# Patient Record
Sex: Male | Born: 2000 | Race: Black or African American | Hispanic: No | Marital: Single | Smoking: Never smoker
Health system: Southern US, Community
[De-identification: ages and names within clinical notes are randomized; demographics above are authoritative.]

## PROBLEM LIST (undated history)

## (undated) DIAGNOSIS — S82892A Other fracture of left lower leg, initial encounter for closed fracture: Secondary | ICD-10-CM

---
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
## 2022-01-05 ENCOUNTER — Encounter (HOSPITAL_COMMUNITY): Payer: Self-pay | Admitting: *Deleted

## 2022-01-05 ENCOUNTER — Emergency Department (HOSPITAL_COMMUNITY)
Admission: EM | Admit: 2022-01-05 | Discharge: 2022-01-05 | Disposition: A | Discharge status: Home / Self Care | Payer: Medicaid Other | Attending: Emergency Medicine | Admitting: Emergency Medicine

## 2022-01-05 ENCOUNTER — Other Ambulatory Visit: Payer: Self-pay

## 2022-01-05 DIAGNOSIS — L02411 Cutaneous abscess of right axilla: Secondary | ICD-10-CM | POA: Diagnosis present

## 2022-01-05 MED ORDER — DOXYCYCLINE HYCLATE 100 MG PO CAPS: 100.0000 mg | ORAL_CAPSULE | Freq: Two times a day (BID) | ORAL | 0 refills | Status: DC

## 2022-01-05 NOTE — Discharge Instructions (Addendum)
You are seen in today for evaluation of a bump under your right arm.  At this time the bump is too small to drain.  I have placed you on an antibiotic to take 2 times a day for the next 7 days.  Please apply warm compresses to the area multiple times daily to see if it will come to ahead.  This may need to be drained at a later date.  You can take ibuprofen as needed for pain.  If you have any concern, new or worsening symptoms, please return to the nearest emergency department for evaluation.

## 2022-01-05 NOTE — ED Provider Notes (Signed)
[facility] EMERGENCY DEPARTMENT Provider Note   CSN: [id number] Arrival date & time: 01/05/22  1251     History Chief Complaint  Patient presents with   Abscess    Marcus Sutton is a 21 y.o. male presents to the emergency department for evaluation of small bump under right axilla for the past two days. Reports tenderness, no radiation.  No drainage.  No prior incidence of this. Denies any medical or surgical history.  Denies any new medications.  No known drug allergies.  Denies any tobacco, EtOH, or illicit drug use.   Abscess     Home Medications Prior to Admission medications   Medication Sig Start Date End Date Taking? Authorizing Provider  doxycycline (VIBRAMYCIN) 100 MG capsule Take 1 capsule (100 mg total) by mouth 2 (two) times daily. 01/05/22  Yes Achille Rich, PA-C  meclizine (ANTIVERT) 12.5 MG tablet Take 1 tablet (12.5 mg total) by mouth 3 (three) times daily as needed for dizziness. 06/07/19   Robinson, [place] N, PA-C  methocarbamol (ROBAXIN) 500 MG tablet Take 1 tablet (500 mg total) by mouth 2 (two) times daily. 08/24/21   Couture, Cortni S, PA-C  naproxen (NAPROSYN) 375 MG tablet Take 1 tablet (375 mg total) by mouth 2 (two) times daily. 08/24/21   Couture, Cortni S, PA-C      Allergies    Patient has no known allergies.    Review of Systems   Review of Systems  Skin:  Positive for wound.  All other systems reviewed and are negative.  Physical Exam Updated Vital Signs BP 101/71 (BP Location: Right Arm)    Pulse 87    Temp 97.9 F (36.6 C) (Oral)    Resp 16    Ht 5\' 9"  (1.753 m)    Wt 56.7 kg    SpO2 97%    BMI 18.46 kg/m  Physical Exam Vitals and nursing note reviewed.  Constitutional:      General: He is not in acute distress.    Appearance: Normal appearance. He is not toxic-appearing.  Eyes:     General: No scleral icterus. Pulmonary:     Effort: Pulmonary effort is normal. No respiratory distress.  Skin:    General: Skin is dry.     Findings:  No rash.     Comments: Small area of induration around 1cm in size under the right axilla. No central pustule. No drainage. No fluctuance. No surrounding erythema or red streaking.   Neurological:     General: No focal deficit present.     Mental Status: He is alert. Mental status is at baseline.  Psychiatric:        Mood and Affect: Mood normal.    ED Results / Procedures / Treatments   Labs (all labs ordered are listed, but only abnormal results are displayed) Labs Reviewed - No data to display  EKG None  Radiology No results found.  Procedures Procedures   Medications Ordered in ED Medications - No data to display  ED Course/ Medical Decision Making/ A&P                           Medical Decision Making  21 year old male presents emerged part for evaluation of bump under his right axilla for the past 2 days.  Differential diagnosis includes cyst versus abscess versus lymphadenopathy.  Vital signs stable.  Afebrile.  Physical exam shows a small area of smaller 1 cm area of induration with  no fluctuance, overlying erythema, or red streaking.  Mildly tender to palpation.  Will discharge home on doxycycline and recommend warm compresses to the area daily.  Strict return precautions discussed.  Patient agrees to plan.  Patient is stable be discharged home in good condition.  Final Clinical Impression(s) / ED Diagnoses Final diagnoses:  Abscess of axilla, right    Rx / DC Orders ED Discharge Orders          Ordered    doxycycline (VIBRAMYCIN) 100 MG capsule  2 times daily        01/05/22 1459              Achille Rich, PA-C 01/05/22 1500    Bethann Berkshire, MD 01/08/22 1012

## 2022-01-05 NOTE — ED Triage Notes (Signed)
Pt c/o abscess to right axilla x 2 days; pt denies any drainage

## 2022-05-21 ENCOUNTER — Ambulatory Visit (HOSPITAL_COMMUNITY)
Admission: RE | Admit: 2022-05-21 | Discharge: 2022-05-21 | Disposition: A | Discharge status: Home / Self Care | Payer: Medicaid Other | Source: Ambulatory Visit | Attending: Physician Assistant | Admitting: Physician Assistant

## 2022-05-21 ENCOUNTER — Other Ambulatory Visit (HOSPITAL_COMMUNITY): Payer: Self-pay | Admitting: Physician Assistant

## 2022-05-21 DIAGNOSIS — M549 Dorsalgia, unspecified: Secondary | ICD-10-CM | POA: Diagnosis not present

## 2022-08-13 ENCOUNTER — Ambulatory Visit (HOSPITAL_COMMUNITY): Payer: Medicaid Other | Attending: Sports Medicine

## 2023-02-08 IMAGING — DX DG SCOLIOSIS EVAL COMPLETE SPINE 1V
1 series · 4 of 4 positions shown · non-contrast
Comparison: None Available.

CLINICAL DATA: Back pain.

EXAM:
DG SCOLIOSIS EVAL COMPLETE SPINE 1V

[Series 1: whole body ap · 0.13mm/px · 4 of 4 slices shown]
[im 1/4]
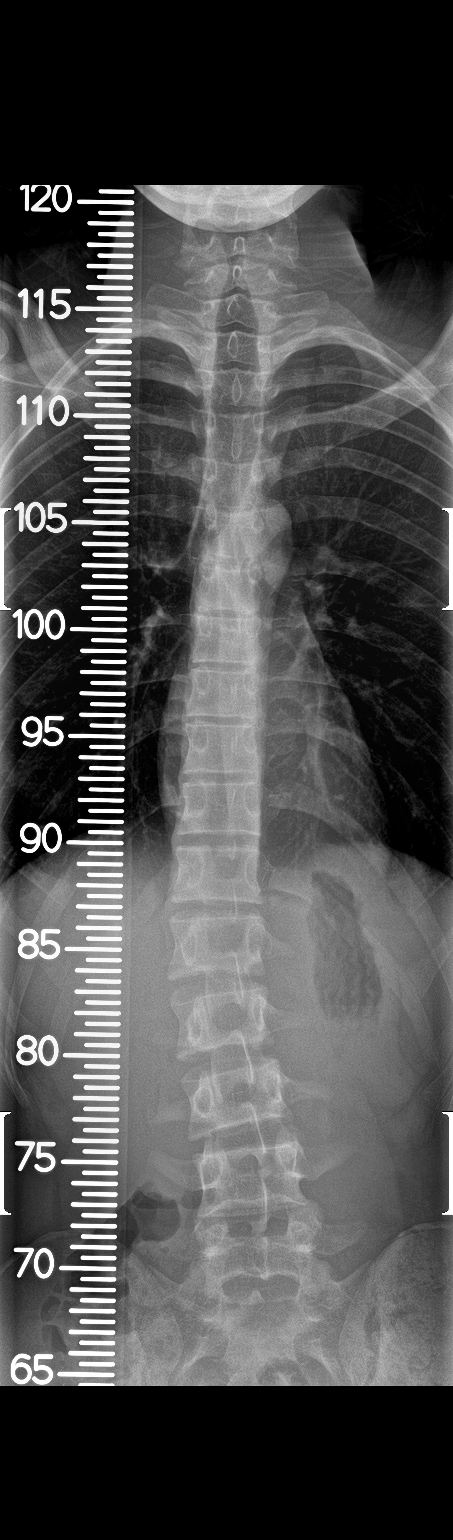
[im 2/4]
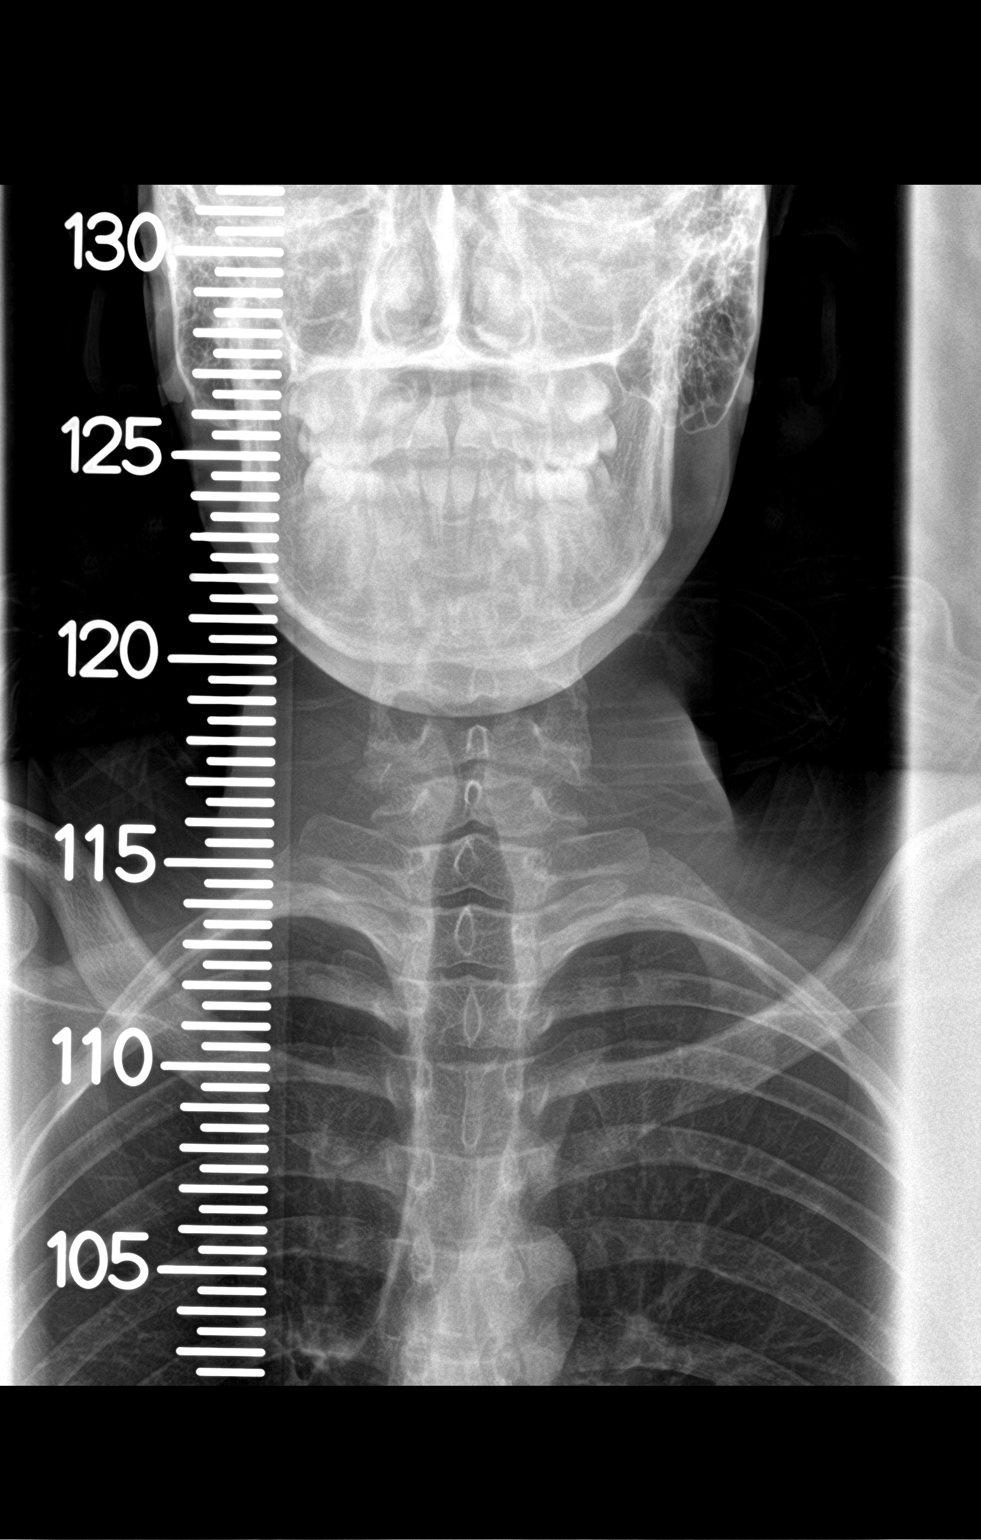
[im 3/4]
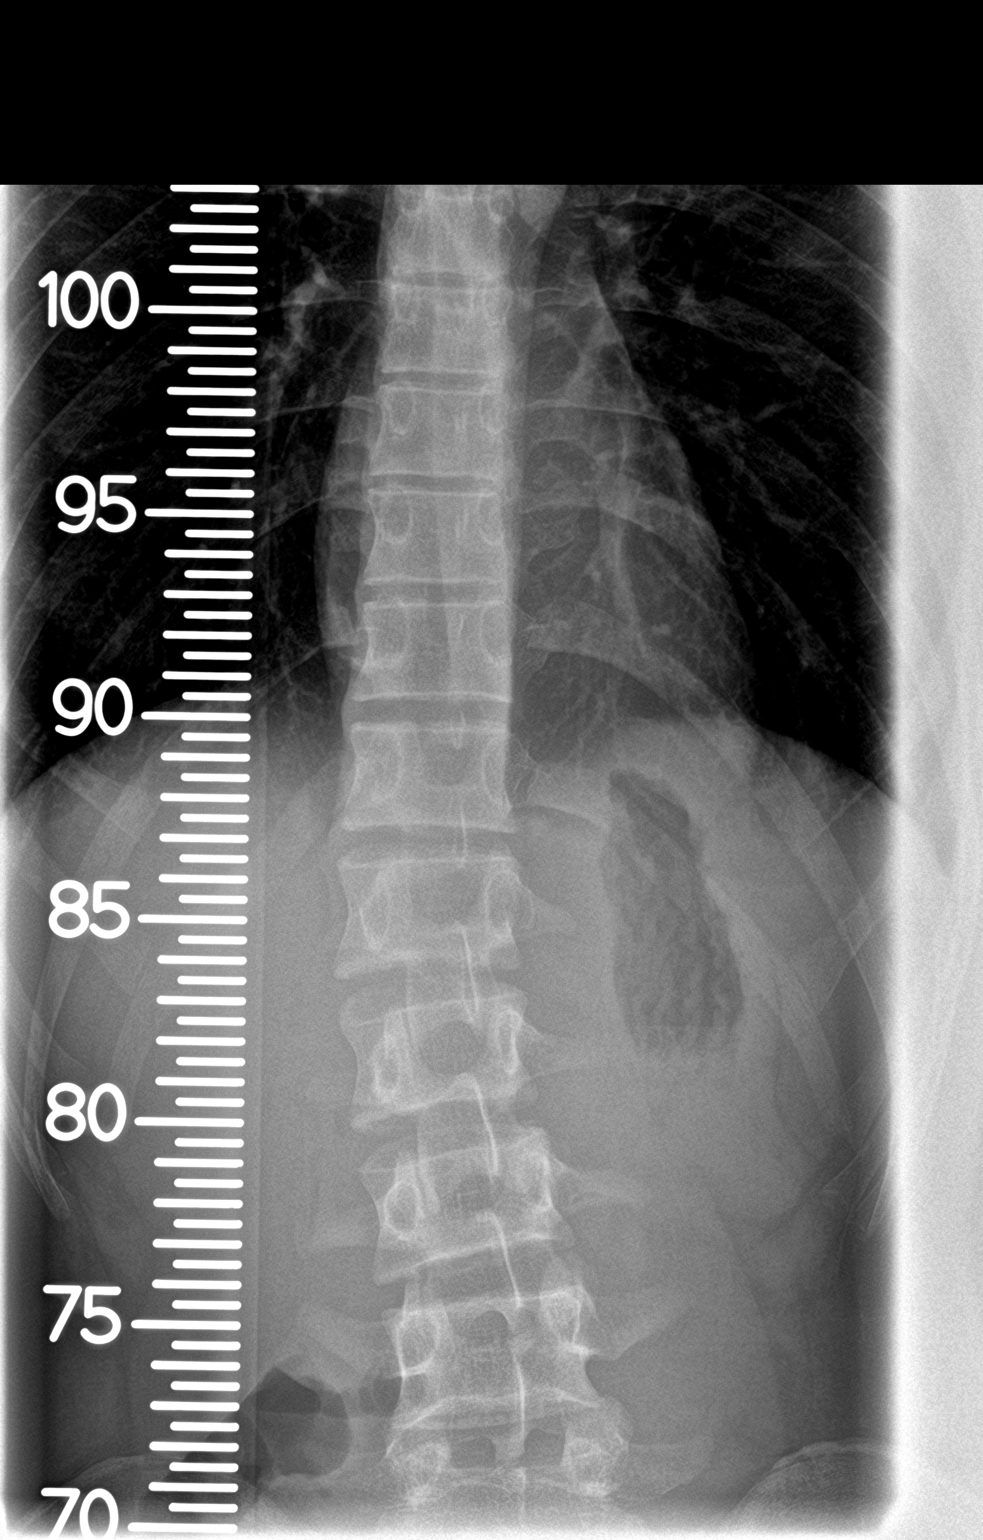
[im 4/4]
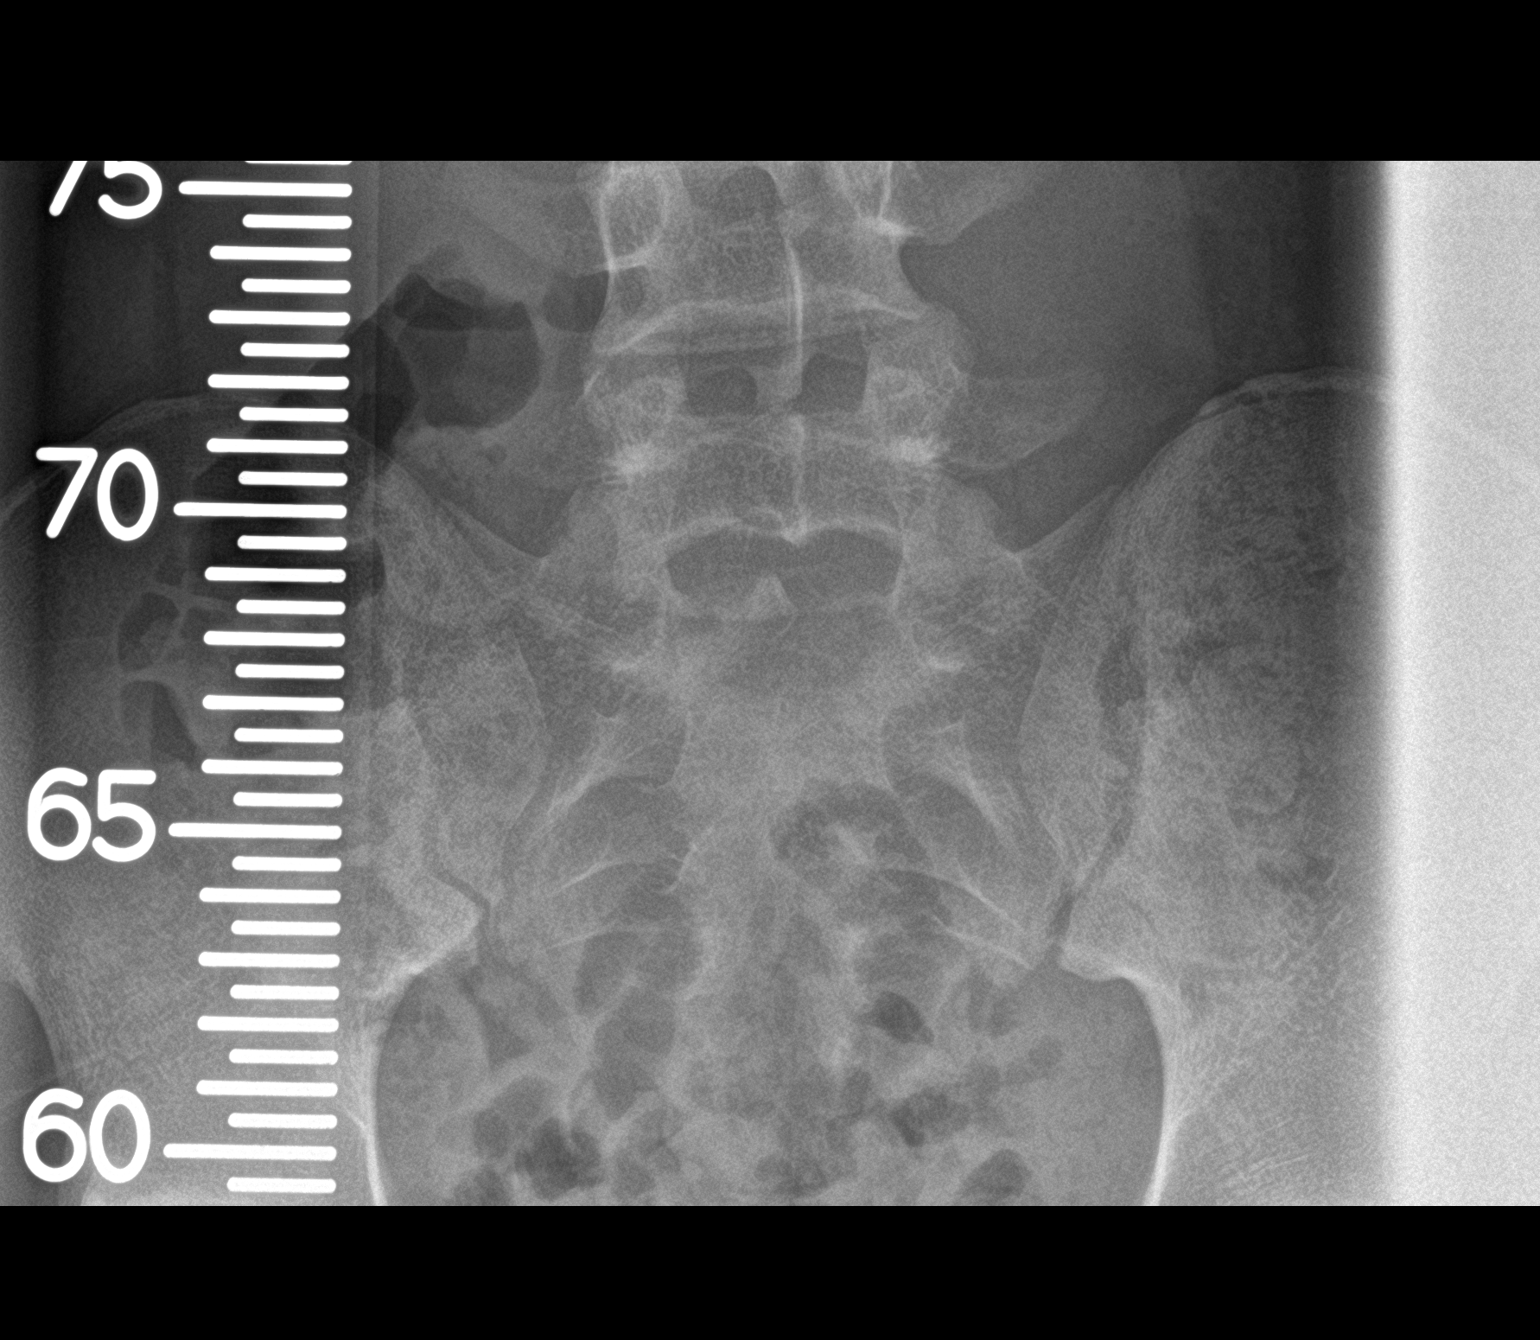

[4 of 4 positions shown; findings below may reference images not displayed]

FINDINGS: Dextroscoliotic curvature of the lower thoracic and lumbar spine of
13 degrees when measured from superior endplate of T12 to inferior
endplate of L3. Upper thoracic spine and included cervical spine
demonstrates normal AP alignment. There are 5 lumbar vertebra in 12
pairs of ribs. No intrinsic vertebral abnormality. Mild pelvic tilt
with left iliac crest 6 mm higher than right. There is no
paravertebral soft tissue abnormality.
IMPRESSION: Dextroscoliotic curvature of the lower thoracic and lumbar spine of
13 degrees.

## 2023-05-11 ENCOUNTER — Ambulatory Visit
Admission: EM | Admit: 2023-05-11 | Discharge: 2023-05-11 | Disposition: A | Discharge status: Home / Self Care | Payer: Medicaid Other | Attending: Urgent Care | Admitting: Urgent Care

## 2023-05-11 DIAGNOSIS — B349 Viral infection, unspecified: Secondary | ICD-10-CM | POA: Diagnosis not present

## 2023-05-11 DIAGNOSIS — F17200 Nicotine dependence, unspecified, uncomplicated: Secondary | ICD-10-CM | POA: Diagnosis present

## 2023-05-11 DIAGNOSIS — Z1152 Encounter for screening for COVID-19: Secondary | ICD-10-CM | POA: Insufficient documentation

## 2023-05-11 DIAGNOSIS — H6121 Impacted cerumen, right ear: Secondary | ICD-10-CM | POA: Diagnosis present

## 2023-05-11 MED ORDER — PROMETHAZINE-DM 6.25-15 MG/5ML PO SYRP: 2.5000 mL | ORAL_SOLUTION | Freq: Three times a day (TID) | ORAL | 0 refills | Status: AC | PRN

## 2023-05-11 MED ORDER — PSEUDOEPHEDRINE HCL 60 MG PO TABS: 60.0000 mg | ORAL_TABLET | Freq: Three times a day (TID) | ORAL | 0 refills | Status: AC | PRN

## 2023-05-11 MED ORDER — CARBAMIDE PEROXIDE 6.5 % OT SOLN: 5.0000 [drp] | Freq: Every day | OTIC | 0 refills | Status: AC | PRN

## 2023-05-11 MED ORDER — CETIRIZINE HCL 10 MG PO TABS: 10.0000 mg | ORAL_TABLET | Freq: Every day | ORAL | 0 refills | Status: AC

## 2023-05-11 NOTE — ED Provider Notes (Signed)
[facility]  Note:  This document was prepared using Conservation officer, historic buildings and may include unintentional dictation errors.  MRN: [id number] DOB: April 12, 2001  Subjective:   Marcus Sutton is a 22 y.o. male presenting for 3 day history of runny nose, stuff nose, body aches, loss of taste and smell. Has had a dry cough. No fever, chest pain, shob, wheezing. Smokes marijuana a few times a week. No history of asthma.  Patient would also like his ears checked for earwax buildup.  Has previously used Debrox but does not know if he is using it correctly.  Does not use Q-tips.  No chronic medications.    No Known Allergies  Past Medical History:  Diagnosis Date   Ankle fracture, left      History reviewed. No pertinent surgical history.  History reviewed. No pertinent family history.  Social History   Tobacco Use   Smoking status: Never   Smokeless tobacco: Never  Vaping Use   Vaping Use: Never used  Substance Use Topics   Alcohol use: No   Drug use: No    ROS   Objective:   Vitals: BP 101/69 (BP Location: Left Arm)   Pulse 88   Temp 98.5 F (36.9 C) (Oral)   Resp 17   SpO2 96%   Physical Exam Constitutional:      General: He is not in acute distress.    Appearance: Normal appearance. He is well-developed and normal weight. He is not ill-appearing, toxic-appearing or diaphoretic.  HENT:     Head: Normocephalic and atraumatic.     Right Ear: Tympanic membrane, ear canal and external ear normal. No drainage, swelling or tenderness. No middle ear effusion. There is impacted cerumen (TM clear thereafter). Tympanic membrane is not erythematous or bulging.     Left Ear: Tympanic membrane, ear canal and external ear normal. No drainage, swelling or tenderness.  No middle ear effusion. There is no impacted cerumen. Tympanic membrane is not erythematous or bulging.     Nose: Congestion present. No rhinorrhea.     Mouth/Throat:     Mouth:  Mucous membranes are moist.     Pharynx: No oropharyngeal exudate or posterior oropharyngeal erythema.  Eyes:     General: No scleral icterus.       Right eye: No discharge.        Left eye: No discharge.     Extraocular Movements: Extraocular movements intact.     Conjunctiva/sclera: Conjunctivae normal.  Cardiovascular:     Rate and Rhythm: Normal rate and regular rhythm.     Heart sounds: Normal heart sounds. No murmur heard.    No friction rub. No gallop.  Pulmonary:     Effort: Pulmonary effort is normal. No respiratory distress.     Breath sounds: Normal breath sounds. No stridor. No wheezing, rhonchi or rales.  Musculoskeletal:     Cervical back: Normal range of motion and neck supple. No rigidity. No muscular tenderness.  Neurological:     General: No focal deficit present.     Mental Status: He is alert and oriented to person, place, and time.  Psychiatric:        Mood and Affect: Mood normal.        Behavior: Behavior normal.        Thought Content: Thought content normal.        Judgment: Judgment normal.     Ear lavage performed using mixture of peroxide and water.  Pressure irrigation performed using a bottle and a thin ear tube.  Right ear lavage.  No curette was used.   Assessment and Plan :   PDMP not reviewed this encounter.  1. Acute viral syndrome   2. Smoker   3. Impacted cerumen of right ear    Deferred imaging given clear cardiopulmonary exam, hemodynamically stable vital signs. Will manage for viral illness such as viral URI, viral syndrome, viral rhinitis, COVID-19. Recommended supportive care. Offered scripts for symptomatic relief. Testing is pending. Successful right ear lavage.  General management of cerumen impaction reviewed with patient.  Anticipatory guidance provided. Counseled patient on potential for adverse effects with medications prescribed/recommended today, ER and return-to-clinic precautions discussed, patient verbalized  understanding.    Wallis Bamberg, [place] 05/11/23 1540

## 2023-05-11 NOTE — ED Triage Notes (Signed)
Pt presents with c/o cold sxs. Pt states he has not been able to taste his food although he can smell.   Denies sore throat right now and fever.   Home interventions: day/night flu medicine.

## 2023-05-11 NOTE — Discharge Instructions (Addendum)
We will notify you of your test results as they arrive and may take between about 24 hours.  I encourage you to sign up for MyChart if you have not already done so as this can be the easiest way for [place] to communicate results to you online or through a phone app.  Generally, we only contact you if it is a positive test result.  In the meantime, if you develop worsening symptoms including fever, chest pain, shortness of breath despite our current treatment plan then please report to the emergency room as this may be a sign of worsening status from possible viral infection.  Otherwise, we will manage this as a viral syndrome. For sore throat or cough try using a honey-based tea. Use 3 teaspoons of honey with juice squeezed from half lemon. Place shaved pieces of ginger into 1/2-1 cup of water and warm over stove top. Then mix the ingredients and repeat every 4 hours as needed. Please take Tylenol 500mg -650mg  every 6 hours for aches and pains, fevers. Hydrate very well with at least 2 liters of water. Eat light meals such as soups to replenish electrolytes and soft fruits, veggies. Start an antihistamine like Zyrtec (10mg  daily) for postnasal drainage, sinus congestion.  You can take this together with pseudoephedrine (Sudafed) at a dose of 60 mg 2-3 times a day as needed for the same kind of congestion.  Use the cough medications as needed.   Use Debrox as needed for ear wax build up.

## 2023-05-12 LAB — SARS CORONAVIRUS 2 (TAT 6-24 HRS): SARS Coronavirus 2: NEGATIVE

## 2023-10-27 ENCOUNTER — Emergency Department (HOSPITAL_COMMUNITY)
Admission: EM | Admit: 2023-10-27 | Discharge: 2023-10-27 | Disposition: A | Discharge status: Home / Self Care | Payer: Medicaid Other | Attending: Emergency Medicine | Admitting: Emergency Medicine

## 2023-10-27 ENCOUNTER — Other Ambulatory Visit: Payer: Self-pay

## 2023-10-27 ENCOUNTER — Encounter (HOSPITAL_COMMUNITY): Payer: Self-pay | Admitting: Radiology

## 2023-10-27 DIAGNOSIS — A568 Sexually transmitted chlamydial infection of other sites: Secondary | ICD-10-CM | POA: Diagnosis not present

## 2023-10-27 DIAGNOSIS — R300 Dysuria: Secondary | ICD-10-CM | POA: Diagnosis present

## 2023-10-27 DIAGNOSIS — N342 Other urethritis: Secondary | ICD-10-CM

## 2023-10-27 DIAGNOSIS — A64 Unspecified sexually transmitted disease: Secondary | ICD-10-CM

## 2023-10-27 LAB — URINALYSIS, ROUTINE W REFLEX MICROSCOPIC
Bacteria, UA: NONE SEEN
Bilirubin Urine: NEGATIVE
Glucose, UA: NEGATIVE mg/dL
Hgb urine dipstick: NEGATIVE
Ketones, ur: NEGATIVE mg/dL
Nitrite: NEGATIVE
Protein, ur: NEGATIVE mg/dL
Specific Gravity, Urine: 1.019 (ref 1.005–1.030)
pH: 7 (ref 5.0–8.0)

## 2023-10-27 MED ORDER — DOXYCYCLINE HYCLATE 100 MG PO CAPS: 100.0000 mg | ORAL_CAPSULE | Freq: Two times a day (BID) | ORAL | 0 refills | Status: AC

## 2023-10-27 MED ORDER — LIDOCAINE HCL (PF) 1 % IJ SOLN
INTRAMUSCULAR | Status: AC
  Administered 2023-10-27: 1 mL
  Filled 2023-10-27: qty 2

## 2023-10-27 MED ORDER — CEFTRIAXONE SODIUM 500 MG IJ SOLR
500.0000 mg | Freq: Once | INTRAMUSCULAR | Status: AC
  Administered 2023-10-27: 500 mg via INTRAMUSCULAR
  Filled 2023-10-27: qty 500

## 2023-10-27 NOTE — ED Provider Notes (Signed)
[place] EMERGENCY DEPARTMENT AT [facility] Provider Note   CSN: [id number] Arrival date & time: 10/27/23  4696     History  Chief Complaint  Patient presents with   Dysuria    Marcus Sutton is a 22 y.o. male.   Dysuria Presenting symptoms: dysuria and penile discharge   Associated symptoms: no abdominal pain, no penile swelling and no scrotal swelling    This patient is a 22 year old male, denies any prior medical history, states he takes no daily medicines, has had a couple of days of burning with urination and a slight discharge from his penis which he reports is kind of a watery burning sensation.  He states he also has a burning sensation when he ejaculates.  He denies abdominal pain fevers or chills, has never had an STD, he does have a new sexual partner and has not been using protection, this is a male partner.    Home Medications Prior to Admission medications   Medication Sig Start Date End Date Taking? Authorizing Provider  doxycycline (VIBRAMYCIN) 100 MG capsule Take 1 capsule (100 mg total) by mouth 2 (two) times daily for 7 days. 10/27/23 11/03/23 Yes Eber Hong, MD  carbamide peroxide (DEBROX) 6.5 % OTIC solution Place 5 drops into both ears daily as needed. 05/11/23   Wallis Bamberg, PA-C  cetirizine (ZYRTEC ALLERGY) 10 MG tablet Take 1 tablet (10 mg total) by mouth daily. 05/11/23   Wallis Bamberg, PA-C  promethazine-dextromethorphan (PROMETHAZINE-DM) 6.25-15 MG/5ML syrup Take 2.5 mLs by mouth 3 (three) times daily as needed for cough. 05/11/23   Wallis Bamberg, PA-C  pseudoephedrine (SUDAFED) 60 MG tablet Take 1 tablet (60 mg total) by mouth every 8 (eight) hours as needed for congestion. 05/11/23   Wallis Bamberg, PA-C      Allergies    Patient has no known allergies.    Review of Systems   Review of Systems  Gastrointestinal:  Negative for abdominal pain.  Genitourinary:  Positive for dysuria and penile discharge. Negative for genital sores, penile  swelling, scrotal swelling and testicular pain.    Physical Exam Updated Vital Signs BP 106/75 (BP Location: Right Arm)   Pulse 64   Temp 99.1 F (37.3 C) (Oral)   Resp 16   Ht 1.753 m (5\' 9" )   Wt 56.7 kg   SpO2 100%   BMI 18.46 kg/m  Physical Exam Vitals and nursing note reviewed.  Constitutional:      Appearance: He is well-developed. He is not diaphoretic.  HENT:     Head: Normocephalic and atraumatic.  Eyes:     General:        Right eye: No discharge.        Left eye: No discharge.     Conjunctiva/sclera: Conjunctivae normal.  Pulmonary:     Effort: Pulmonary effort is normal. No respiratory distress.  Genitourinary:    Comments: Normal-appearing penis scrotum and testicles other than a clear discharge from the penile urethral meatus, no rashes, no lymphadenopathy Skin:    General: Skin is warm and dry.     Findings: No erythema or rash.  Neurological:     Mental Status: He is alert.     Coordination: Coordination normal.     ED Results / Procedures / Treatments   Labs (all labs ordered are listed, but only abnormal results are displayed) Labs Reviewed  URINALYSIS, ROUTINE W REFLEX MICROSCOPIC  GC/CHLAMYDIA PROBE AMP ([place]) NOT AT [facility]    EKG None  Radiology No results found.  Procedures Procedures    Medications Ordered in ED Medications  cefTRIAXone (ROCEPHIN) injection 500 mg (has no administration in time range)    ED Course/ Medical Decision Making/ A&P                                 Medical Decision Making Risk Prescription drug management.   Labs: Sample sent for gonorrhea and chlamydia  Medication management: Given Rocephin and prescription for doxycycline  ED course: Patient likely has STD given history and exposure to unprotected sexual intercourse with a new partner.  Patient given instructions on follow-up and the need for education of partners and safe sex practices.  He will be given doxycycline for home, stable  for discharge, given Rocephin in ED.        Final Clinical Impression(s) / ED Diagnoses Final diagnoses:  STI (sexually transmitted infection)  Urethritis    Rx / DC Orders ED Discharge Orders          Ordered    doxycycline (VIBRAMYCIN) 100 MG capsule  2 times daily        10/27/23 1126              Eber Hong, MD 10/27/23 1126

## 2023-10-27 NOTE — Discharge Instructions (Signed)
We have treated you for STDs including gonorrhea and chlamydia.  Please take doxycycline twice a day for the next 7 days to complete your treatment.  No sexual intercourse for the next 2 weeks  Please inform any partners that you have that they need to be tested and treated for STDs if your test come back positive.  These results will come to your phone within the next 48 hours with the results.  I would recommend that you go to the health department to be tested for HIV as this is not something that we test for in the emergency department.

## 2023-10-27 NOTE — ED Triage Notes (Addendum)
Pt endorses burning with urination that started 2 days ago. Pt also endorses light green discharge from his penis. Pt concerned for SDI.

## 2023-10-29 LAB — GC/CHLAMYDIA PROBE AMP (~~LOC~~) NOT AT ARMC
Chlamydia: NEGATIVE
Comment: NEGATIVE
Comment: NORMAL
Neisseria Gonorrhea: NEGATIVE

## 2023-11-26 ENCOUNTER — Other Ambulatory Visit: Payer: Self-pay

## 2023-11-26 ENCOUNTER — Emergency Department (HOSPITAL_COMMUNITY)
Admission: EM | Admit: 2023-11-26 | Discharge: 2023-11-26 | Disposition: A | Discharge status: Home / Self Care | Payer: Medicaid Other | Attending: Emergency Medicine | Admitting: Emergency Medicine

## 2023-11-26 ENCOUNTER — Encounter (HOSPITAL_COMMUNITY): Payer: Self-pay

## 2023-11-26 DIAGNOSIS — R300 Dysuria: Secondary | ICD-10-CM | POA: Diagnosis present

## 2023-11-26 DIAGNOSIS — N341 Nonspecific urethritis: Secondary | ICD-10-CM | POA: Diagnosis not present

## 2023-11-26 DIAGNOSIS — N342 Other urethritis: Secondary | ICD-10-CM

## 2023-11-26 DIAGNOSIS — Z202 Contact with and (suspected) exposure to infections with a predominantly sexual mode of transmission: Secondary | ICD-10-CM

## 2023-11-26 LAB — URINALYSIS, ROUTINE W REFLEX MICROSCOPIC
Bacteria, UA: NONE SEEN
Bilirubin Urine: NEGATIVE
Glucose, UA: NEGATIVE mg/dL
Hgb urine dipstick: NEGATIVE
Ketones, ur: NEGATIVE mg/dL
Nitrite: NEGATIVE
Protein, ur: NEGATIVE mg/dL
Specific Gravity, Urine: 1.017 (ref 1.005–1.030)
pH: 6 (ref 5.0–8.0)

## 2023-11-26 MED ORDER — CEFTRIAXONE SODIUM 500 MG IJ SOLR
500.0000 mg | Freq: Once | INTRAMUSCULAR | Status: AC
  Administered 2023-11-26: 500 mg via INTRAMUSCULAR
  Filled 2023-11-26: qty 500

## 2023-11-26 MED ORDER — LIDOCAINE HCL (PF) 1 % IJ SOLN
INTRAMUSCULAR | Status: AC
  Administered 2023-11-26: 1 mL
  Filled 2023-11-26: qty 5

## 2023-11-26 MED ORDER — DOXYCYCLINE HYCLATE 100 MG PO TABS
100.0000 mg | ORAL_TABLET | Freq: Once | ORAL | Status: AC
  Administered 2023-11-26: 100 mg via ORAL
  Filled 2023-11-26: qty 1

## 2023-11-26 MED ORDER — DOXYCYCLINE HYCLATE 100 MG PO CAPS: 100.0000 mg | ORAL_CAPSULE | Freq: Two times a day (BID) | ORAL | 0 refills | Status: AC

## 2023-11-26 NOTE — ED Triage Notes (Signed)
Pt reports:  "My pee is burning" Happening off and on Recent testing was negative Reports unprotected sex

## 2023-11-26 NOTE — Discharge Instructions (Addendum)
You were seen in the emergency department today with concerns of burning with urination.  Based on your history and your symptoms, I assume that you currently have an STI.  You are tested for gonorrhea and chlamydia and these test will be available in the next day or 2.  Given your symptoms and exposure to possible sexually transmitted infection, you are treated with a dose of Rocephin and doxycycline in the emergency department.  He should continue take the doxycycline for the next 7 days as prescribed.  I would strongly recommend having any sexual partners tested and treated for STIs as well given her current symptoms.  Avoid any sexual activity for the next 2 weeks.  Thereafter, you may return to sexual activity but try to use condoms to prevent risk of infection.

## 2023-11-26 NOTE — ED Provider Notes (Signed)
[place] EMERGENCY DEPARTMENT AT [facility] Provider Note   CSN: [id number] Arrival date & time: 11/26/23  1457     History No chief complaint on file.   Marcus Sutton is a 22 y.o. male.  Patient presents the emergency department with concerns of dysuria.  Patient reports he has been having dysuria and a green discharge from his penis for the last several days.  Endorses unprotected sex and was recently treated for STIs but states that partner was not treated.  On chart review, appears the patient was seen in the emergency department 10/27/2023 for similar symptoms and treated with Rocephin and doxycycline.  Patient does report that his symptoms resolved after period of antibiotic therapy although returned once again after initiating sexual behavior with same partner.  He reports that partner was not tested or treated for any STIs during this period of time.  Denies any new sexual partners.  No testicle pain, abdominal pain, fever, or general malaise or weakness.  HPI     Home Medications Prior to Admission medications   Medication Sig Start Date End Date Taking? Authorizing Provider  doxycycline (VIBRAMYCIN) 100 MG capsule Take 1 capsule (100 mg total) by mouth 2 (two) times daily for 7 days. 11/26/23 12/03/23 Yes Smitty Knudsen, PA-C  carbamide peroxide (DEBROX) 6.5 % OTIC solution Place 5 drops into both ears daily as needed. 05/11/23   Wallis Bamberg, PA-C  cetirizine (ZYRTEC ALLERGY) 10 MG tablet Take 1 tablet (10 mg total) by mouth daily. 05/11/23   Wallis Bamberg, PA-C  promethazine-dextromethorphan (PROMETHAZINE-DM) 6.25-15 MG/5ML syrup Take 2.5 mLs by mouth 3 (three) times daily as needed for cough. 05/11/23   Wallis Bamberg, PA-C  pseudoephedrine (SUDAFED) 60 MG tablet Take 1 tablet (60 mg total) by mouth every 8 (eight) hours as needed for congestion. 05/11/23   Wallis Bamberg, PA-C      Allergies    Patient has no known allergies.    Review of Systems   Review of Systems   Genitourinary:  Positive for dysuria and penile discharge.  All other systems reviewed and are negative.   Physical Exam Updated Vital Signs BP 116/74 (BP Location: Right Arm)   Pulse 89   Temp 98.9 F (37.2 C)   Resp 20   Ht 5\' 9"  (1.753 m)   Wt 56.7 kg   SpO2 98%   BMI 18.46 kg/m  Physical Exam Vitals and nursing note reviewed.  Constitutional:      General: He is not in acute distress.    Appearance: He is well-developed.  HENT:     Head: Normocephalic and atraumatic.  Eyes:     Conjunctiva/sclera: Conjunctivae normal.  Cardiovascular:     Rate and Rhythm: Normal rate and regular rhythm.  Pulmonary:     Effort: Pulmonary effort is normal. No respiratory distress.     Breath sounds: Normal breath sounds.  Abdominal:     Palpations: Abdomen is soft.     Tenderness: There is no abdominal tenderness.  Musculoskeletal:        General: No swelling.     Cervical back: Neck supple.  Skin:    General: Skin is warm and dry.     Capillary Refill: Capillary refill takes less than 2 seconds.  Neurological:     Mental Status: He is alert.  Psychiatric:        Mood and Affect: Mood normal.     ED Results / Procedures / Treatments   Labs (all  labs ordered are listed, but only abnormal results are displayed) Labs Reviewed  URINALYSIS, ROUTINE W REFLEX MICROSCOPIC - Abnormal; Notable for the following components:      Result Value   Leukocytes,Ua TRACE (*)    All other components within normal limits  GC/CHLAMYDIA PROBE AMP ([place]) NOT AT [facility]    EKG None  Radiology No results found.  Procedures Procedures   Medications Ordered in ED Medications  cefTRIAXone (ROCEPHIN) injection 500 mg (has no administration in time range)  doxycycline (VIBRA-TABS) tablet 100 mg (has no administration in time range)    ED Course/ Medical Decision Making/ A&P                               Medical Decision Making Amount and/or Complexity of Data Reviewed Labs:  ordered.  Risk Prescription drug management.   This patient presents to the ED for concern of dysuria.  Differential diagnosis includes UTI, pyelonephritis, STI, trichomoniasis   Lab Tests:  I Ordered, and personally interpreted labs.  The pertinent results include: Urinalysis with trace leukocytes, GC chlamydia pending   Medicines ordered and prescription drug management:  I ordered medication including Rocephin, doxycycline for STI Reevaluation of the patient after these medicines showed that the patient stayed the same I have reviewed the patients home medicines and have made adjustments as needed   Problem List / ED Course:  Patient presents the emergency department concerns of dysuria.  Reports the symptoms have been ongoing for the last several days and are uncomfortable at all times.  Also endorses a greenish type discharge coming from his urethra.  States that he was recently treated for STIs about 1 month ago.  States that he has now had sexual activity once again with same partner but states that that partner was not tested or treated for STIs during this period of time.  Denies any testicle pain, abdominal pain, generalized weakness or malaise, or fevers. Based on patient's history, highly suspect STIs.  Urinalysis collected from triage does show some trace leukocytes but no clear signs of UTI.  GC/chlamydia testing ordered for assessment of symptoms.  Given consistent story related to STI exposure, will treat again with Rocephin and doxycycline.  Strongly cautioned patient to have partners tested and treated for STIs as I suspect the patient was reexposed by same partner who was not treated.  Discussed avoiding any sexual activity for about 2 weeks until treatment is fully cleared and patient symptoms resolved.  Also encourage patient to use condoms to reduce the risk of recurrence of transmission.  Discussed return precautions.  No other acute concerns at this time.  Patient  discharged home in stable condition.  Final Clinical Impression(s) / ED Diagnoses Final diagnoses:  Possible exposure to STI  Urethritis    Rx / DC Orders ED Discharge Orders          Ordered    doxycycline (VIBRAMYCIN) 100 MG capsule  2 times daily        11/26/23 1809              Smitty Knudsen, PA-C 11/26/23 Charolette Child, MD 11/28/23 1108

## 2023-11-29 LAB — GC/CHLAMYDIA PROBE AMP (~~LOC~~) NOT AT ARMC
Chlamydia: POSITIVE — AB
Comment: NEGATIVE
Comment: NORMAL
Neisseria Gonorrhea: NEGATIVE
